# Patient Record
Sex: Female | Born: 1975 | Race: White | Marital: Single | Smoking: Current every day smoker
Health system: Southern US, Community
[De-identification: ages and names within clinical notes are randomized; demographics above are authoritative.]

## PROBLEM LIST (undated history)

## (undated) DIAGNOSIS — M549 Dorsalgia, unspecified: Secondary | ICD-10-CM

## (undated) DIAGNOSIS — J45909 Unspecified asthma, uncomplicated: Secondary | ICD-10-CM

## (undated) DIAGNOSIS — F603 Borderline personality disorder: Secondary | ICD-10-CM

## (undated) DIAGNOSIS — F419 Anxiety disorder, unspecified: Secondary | ICD-10-CM

## (undated) DIAGNOSIS — C801 Malignant (primary) neoplasm, unspecified: Secondary | ICD-10-CM

## (undated) DIAGNOSIS — G8929 Other chronic pain: Secondary | ICD-10-CM

## (undated) DIAGNOSIS — F319 Bipolar disorder, unspecified: Secondary | ICD-10-CM

## (undated) DIAGNOSIS — K922 Gastrointestinal hemorrhage, unspecified: Secondary | ICD-10-CM

## (undated) HISTORY — PX: KNEE SURGERY: SHX244

## (undated) HISTORY — PX: CHOLECYSTECTOMY: SHX55

## (undated) HISTORY — PX: ABDOMINAL HYSTERECTOMY: SHX81

---

## 2013-11-15 ENCOUNTER — Encounter (HOSPITAL_COMMUNITY): Payer: Self-pay | Admitting: Emergency Medicine

## 2013-11-15 ENCOUNTER — Emergency Department (HOSPITAL_COMMUNITY)
Admission: EM | Admit: 2013-11-15 | Discharge: 2013-11-16 | Disposition: A | Discharge status: Home / Self Care | Payer: MEDICAID | Attending: Emergency Medicine | Admitting: Emergency Medicine

## 2013-11-15 DIAGNOSIS — F319 Bipolar disorder, unspecified: Secondary | ICD-10-CM | POA: Insufficient documentation

## 2013-11-15 DIAGNOSIS — Z881 Allergy status to other antibiotic agents status: Secondary | ICD-10-CM | POA: Insufficient documentation

## 2013-11-15 DIAGNOSIS — F603 Borderline personality disorder: Secondary | ICD-10-CM | POA: Insufficient documentation

## 2013-11-15 DIAGNOSIS — Z888 Allergy status to other drugs, medicaments and biological substances status: Secondary | ICD-10-CM | POA: Insufficient documentation

## 2013-11-15 DIAGNOSIS — X58XXXA Exposure to other specified factors, initial encounter: Secondary | ICD-10-CM | POA: Insufficient documentation

## 2013-11-15 DIAGNOSIS — G8929 Other chronic pain: Secondary | ICD-10-CM | POA: Insufficient documentation

## 2013-11-15 DIAGNOSIS — Z3202 Encounter for pregnancy test, result negative: Secondary | ICD-10-CM | POA: Insufficient documentation

## 2013-11-15 DIAGNOSIS — Y939 Activity, unspecified: Secondary | ICD-10-CM | POA: Insufficient documentation

## 2013-11-15 DIAGNOSIS — Z885 Allergy status to narcotic agent status: Secondary | ICD-10-CM | POA: Insufficient documentation

## 2013-11-15 DIAGNOSIS — S0003XA Contusion of scalp, initial encounter: Secondary | ICD-10-CM | POA: Insufficient documentation

## 2013-11-15 DIAGNOSIS — F1010 Alcohol abuse, uncomplicated: Secondary | ICD-10-CM | POA: Insufficient documentation

## 2013-11-15 DIAGNOSIS — Z79899 Other long term (current) drug therapy: Secondary | ICD-10-CM | POA: Insufficient documentation

## 2013-11-15 DIAGNOSIS — J45909 Unspecified asthma, uncomplicated: Secondary | ICD-10-CM | POA: Insufficient documentation

## 2013-11-15 DIAGNOSIS — Y929 Unspecified place or not applicable: Secondary | ICD-10-CM | POA: Insufficient documentation

## 2013-11-15 DIAGNOSIS — Z8719 Personal history of other diseases of the digestive system: Secondary | ICD-10-CM | POA: Insufficient documentation

## 2013-11-15 DIAGNOSIS — Z859 Personal history of malignant neoplasm, unspecified: Secondary | ICD-10-CM | POA: Insufficient documentation

## 2013-11-15 DIAGNOSIS — M549 Dorsalgia, unspecified: Secondary | ICD-10-CM | POA: Insufficient documentation

## 2013-11-15 DIAGNOSIS — R45851 Suicidal ideations: Secondary | ICD-10-CM | POA: Insufficient documentation

## 2013-11-15 DIAGNOSIS — R4587 Impulsiveness: Secondary | ICD-10-CM | POA: Insufficient documentation

## 2013-11-15 DIAGNOSIS — IMO0002 Reserved for concepts with insufficient information to code with codable children: Secondary | ICD-10-CM | POA: Insufficient documentation

## 2013-11-15 DIAGNOSIS — F23 Brief psychotic disorder: Secondary | ICD-10-CM

## 2013-11-15 DIAGNOSIS — F29 Unspecified psychosis not due to a substance or known physiological condition: Secondary | ICD-10-CM | POA: Insufficient documentation

## 2013-11-15 HISTORY — DX: Gastrointestinal hemorrhage, unspecified: K92.2

## 2013-11-15 HISTORY — DX: Borderline personality disorder: F60.3

## 2013-11-15 HISTORY — DX: Malignant (primary) neoplasm, unspecified: C80.1

## 2013-11-15 HISTORY — DX: Dorsalgia, unspecified: M54.9

## 2013-11-15 HISTORY — DX: Bipolar disorder, unspecified: F31.9

## 2013-11-15 HISTORY — DX: Other chronic pain: G89.29

## 2013-11-15 HISTORY — DX: Anxiety disorder, unspecified: F41.9

## 2013-11-15 HISTORY — DX: Unspecified asthma, uncomplicated: J45.909

## 2013-11-15 LAB — BASIC METABOLIC PANEL
CO2: 20 mEq/L (ref 19–32)
Calcium: 9.6 mg/dL (ref 8.4–10.5)
Chloride: 108 mEq/L (ref 96–112)
GFR calc Af Amer: >90 mL/min (ref >90)
Potassium: 3.6 mEq/L (ref 3.5–5.1)
Sodium: 144 mEq/L (ref 135–145)

## 2013-11-15 LAB — URINALYSIS, ROUTINE W REFLEX MICROSCOPIC
Glucose, UA: NEGATIVE mg/dL
Ketones, ur: NEGATIVE mg/dL
Leukocytes, UA: NEGATIVE
Nitrite: NEGATIVE
Specific Gravity, Urine: 1.004 — ABNORMAL LOW (ref 1.005–1.030)
pH: 5.5 (ref 5.0–8.0)

## 2013-11-15 LAB — CBC WITH DIFFERENTIAL/PLATELET
Basophils Absolute: 0 10*3/uL (ref 0.0–0.1)
Eosinophils Relative: 1 % (ref 0–5)
HCT: 46 % (ref 36.0–46.0)
Lymphocytes Relative: 38 % (ref 12–46)
Lymphs Abs: 3.8 10*3/uL (ref 0.7–4.0)
MCH: 33.5 pg (ref 26.0–34.0)
MCV: 96.2 fL (ref 78.0–100.0)
Monocytes Absolute: 0.5 10*3/uL (ref 0.1–1.0)
Monocytes Relative: 5 % (ref 3–12)
Neutro Abs: 5.5 10*3/uL (ref 1.7–7.7)
Platelets: 348 10*3/uL (ref 150–400)
RBC: 4.78 MIL/uL (ref 3.87–5.11)
WBC: 9.9 10*3/uL (ref 4.0–10.5)

## 2013-11-15 LAB — URINE MICROSCOPIC-ADD ON

## 2013-11-15 LAB — ACETAMINOPHEN LEVEL: Acetaminophen (Tylenol), Serum: <15 ug/mL (ref 10–30)

## 2013-11-15 LAB — RAPID URINE DRUG SCREEN, HOSP PERFORMED
Cocaine: NOT DETECTED
Opiates: NOT DETECTED
Tetrahydrocannabinol: POSITIVE — AB

## 2013-11-15 LAB — ETHANOL: Alcohol, Ethyl (B): 180 mg/dL — ABNORMAL HIGH (ref 0–11)

## 2013-11-15 MED ORDER — ACETAMINOPHEN 325 MG PO TABS: 650.0000 mg | ORAL_TABLET | ORAL | Status: DC | PRN

## 2013-11-15 MED ORDER — ZIPRASIDONE MESYLATE 20 MG IM SOLR
10.0000 mg | Freq: Once | INTRAMUSCULAR | Status: AC
  Administered 2013-11-15: 10 mg via INTRAMUSCULAR
  Filled 2013-11-15: qty 20

## 2013-11-15 MED ORDER — STERILE WATER FOR INJECTION IJ SOLN
INTRAMUSCULAR | Status: AC
  Administered 2013-11-15: 1.2 mL
  Filled 2013-11-15: qty 10

## 2013-11-15 MED ORDER — LORAZEPAM 2 MG/ML IJ SOLN
2.0000 mg | Freq: Once | INTRAMUSCULAR | Status: AC
  Administered 2013-11-15: 2 mg via INTRAMUSCULAR
  Filled 2013-11-15: qty 1

## 2013-11-15 MED ORDER — LORAZEPAM 1 MG PO TABS
1.0000 mg | ORAL_TABLET | Freq: Three times a day (TID) | ORAL | Status: DC | PRN
  Administered 2013-11-16: 1 mg via ORAL
  Filled 2013-11-15: qty 1

## 2013-11-15 NOTE — ED Notes (Signed)
Patient's mother has arrived, informed her of the need to restrain the patient,

## 2013-11-15 NOTE — ED Notes (Signed)
Pt being verbally aggressive toward staff at this time and uncooperative.

## 2013-11-15 NOTE — ED Notes (Signed)
Pt acting out and kicking shoes off at staff and [employer], continuing to be verbally aggressive and acting out.  De-escalating attempts unsuccessful, will continue to monitor

## 2013-11-15 NOTE — ED Notes (Signed)
MD at bedside assessing pt.  Md removed C-collar and spider straps at this time
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
## 2013-11-15 NOTE — ED Notes (Signed)
Pt mother arrived and was made aware of pt condition.  Mother stated that pt has a long history of bipolar disorder and has a history of cancer, but stated she was not made aware of any recent diagnosis.

## 2013-11-15 NOTE — ED Notes (Signed)
No change in patient behavior, pt continues to be verbally and physically aggressive towards staff, continues to be uncooperative.

## 2013-11-15 NOTE — ED Notes (Signed)
Placed bedside commode in room.
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
## 2013-11-15 NOTE — ED Notes (Signed)
Mother has left contact information, wishes to be informed of changes in the plan of care.  States that if placed in a facility, patient would wish to be placed in one closer to [facility].

## 2013-11-16 ENCOUNTER — Encounter (HOSPITAL_COMMUNITY): Payer: Self-pay | Admitting: Emergency Medicine

## 2013-11-16 ENCOUNTER — Emergency Department (HOSPITAL_COMMUNITY): Payer: MEDICAID

## 2013-11-16 DIAGNOSIS — F1010 Alcohol abuse, uncomplicated: Secondary | ICD-10-CM

## 2013-11-16 DIAGNOSIS — F329 Major depressive disorder, single episode, unspecified: Secondary | ICD-10-CM

## 2013-11-16 DIAGNOSIS — F1910 Other psychoactive substance abuse, uncomplicated: Secondary | ICD-10-CM

## 2013-11-16 MED ORDER — ALBUTEROL SULFATE HFA 108 (90 BASE) MCG/ACT IN AERS: 1.0000 | INHALATION_SPRAY | Freq: Four times a day (QID) | RESPIRATORY_TRACT | Status: DC | PRN

## 2013-11-16 MED ORDER — MONTELUKAST SODIUM 10 MG PO TABS
10.0000 mg | ORAL_TABLET | Freq: Every day | ORAL | Status: DC
  Filled 2013-11-16: qty 1

## 2013-11-16 MED ORDER — PANTOPRAZOLE SODIUM 40 MG PO TBEC
40.0000 mg | DELAYED_RELEASE_TABLET | Freq: Every day | ORAL | Status: DC
  Administered 2013-11-16: 40 mg via ORAL
  Filled 2013-11-16: qty 1

## 2013-11-16 MED ORDER — PRAMIPEXOLE DIHYDROCHLORIDE 0.25 MG PO TABS
0.2500 mg | ORAL_TABLET | Freq: Every day | ORAL | Status: DC
  Filled 2013-11-16: qty 1

## 2013-11-16 MED ORDER — QUETIAPINE FUMARATE 25 MG PO TABS: 100.0000 mg | ORAL_TABLET | Freq: Every day | ORAL | Status: DC

## 2013-11-16 NOTE — ED Notes (Signed)
Pt. Placed in paper scrubs, belongings inventoried and put in bins and locked up with security. Pt. Still asleep at this time.

## 2013-11-16 NOTE — ED Notes (Signed)
Berna Spare interviews patient

## 2013-11-16 NOTE — ED Notes (Signed)
Patient transported to CT 

## 2013-11-16 NOTE — ED Notes (Signed)
Patient back from CT.
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
## 2013-11-16 NOTE — ED Provider Notes (Signed)
Patient seen by psychiatry and cleared for discharge. She denies any suicidal or homicidal thoughts. IVC rescinded by psychiatry  BP 122/89  Pulse 78  Temp(Src) 99.2 F (37.3 C) (Oral)  Resp 16  SpO2 98%   Glynn Octave, MD 11/16/13 1710

## 2013-11-16 NOTE — ED Notes (Addendum)
Patient requested that I call her Mother to find out where her car is as well as where her books are.  Mother stated that she is going to get her car today and take it home.  Mother Lowry Bowl [phone]

## 2013-11-16 NOTE — ED Notes (Signed)
Patient taken to CT.  Cooperative while in CT.
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
## 2013-11-18 NOTE — Consult Note (Signed)
Note reviewed and agreed with  

## 2014-02-16 NOTE — Consult Note (Signed)
Face to face evaluation and agree with this note 

## 2014-05-29 IMAGING — CT CT HEAD W/O CM
1 series · 16 of 30 positions shown, 20 images · non-contrast
Comparison: None.

CLINICAL DATA: Altered mental status.  Possible head injury.

EXAM:
CT HEAD WITHOUT CONTRAST
TECHNIQUE: Contiguous axial images were obtained from the base of the skull
through the vertex without intravenous contrast.

[Series 2: head 5.0 h30s · axial · 0.42mm/px · z∈[+1229,+1364]mm · 16 of 31 slices shown, 20 images]
[im 2/31  brain]
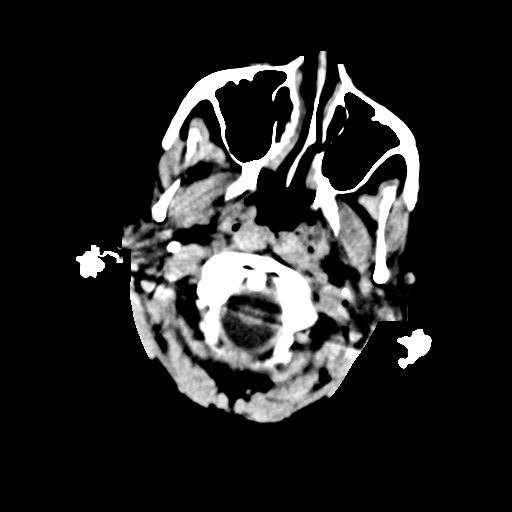
[im 2/31  bone]
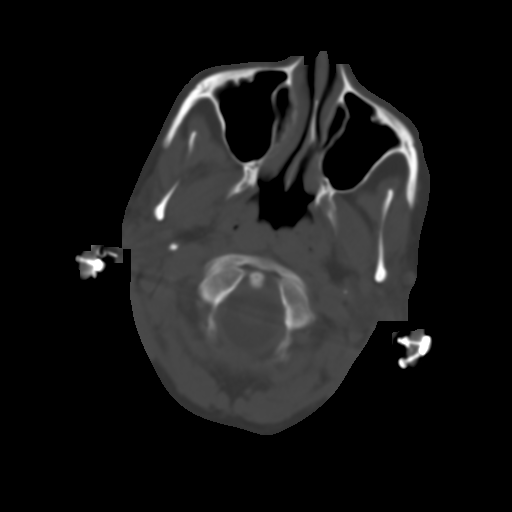
[im 4/31  brain]
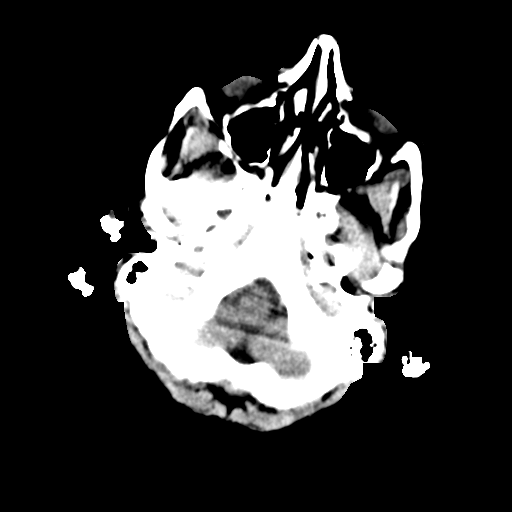
[im 6/31  brain]
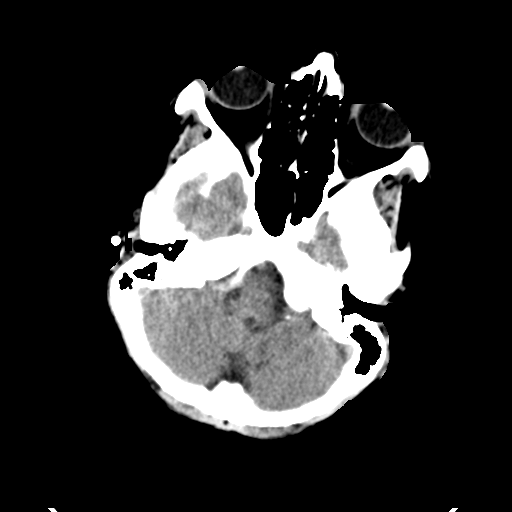
[im 8/31  brain]
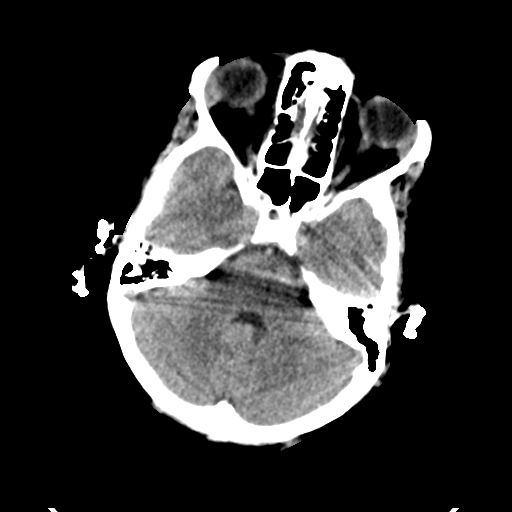
[im 9/31  brain]
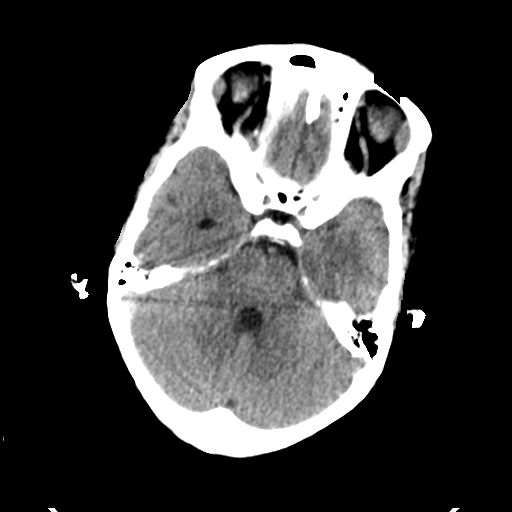
[im 9/31  bone]
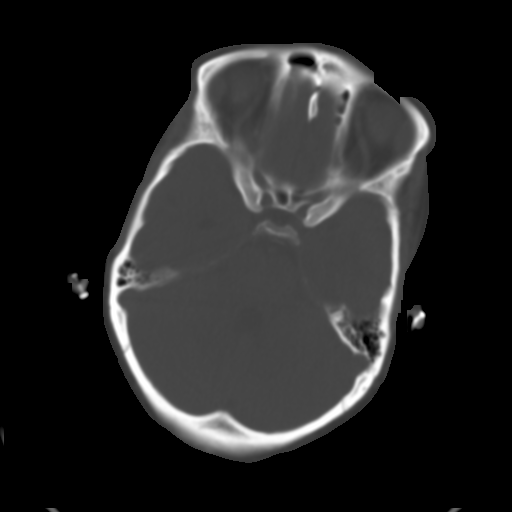
[im 11/31  brain]
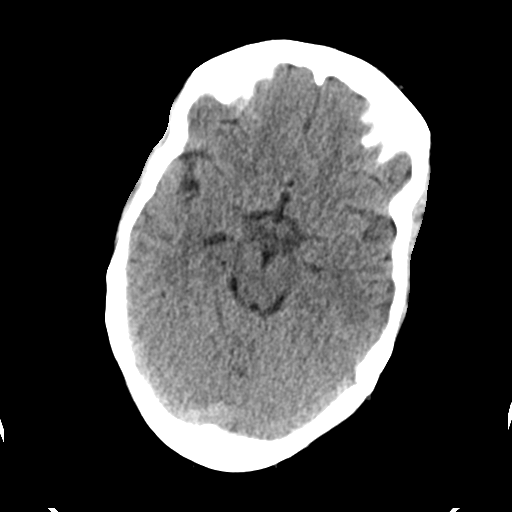
[im 13/31  brain]
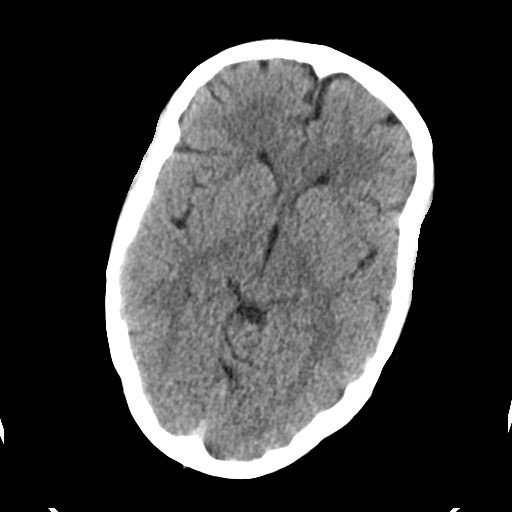
[im 15/31  brain]
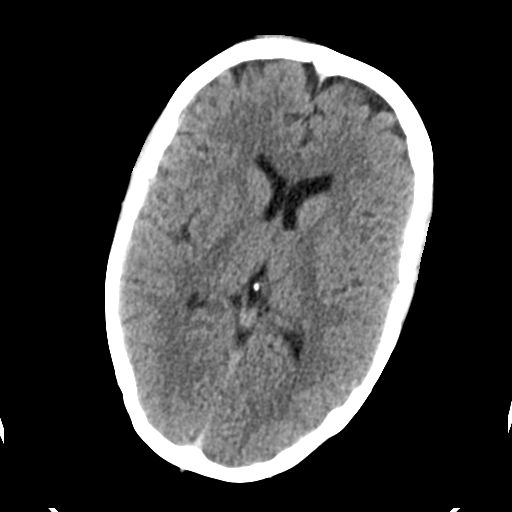
[im 16/31  brain]
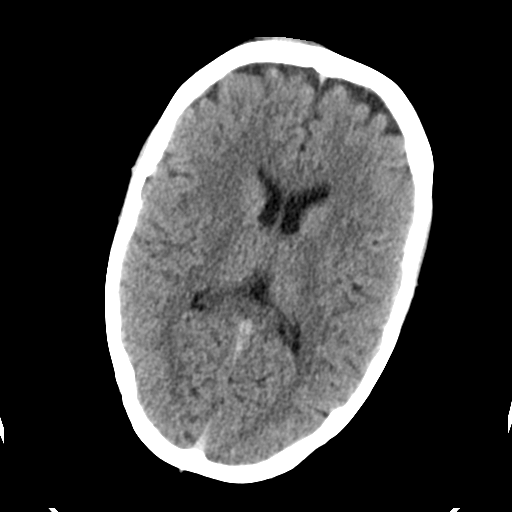
[im 16/31  bone]
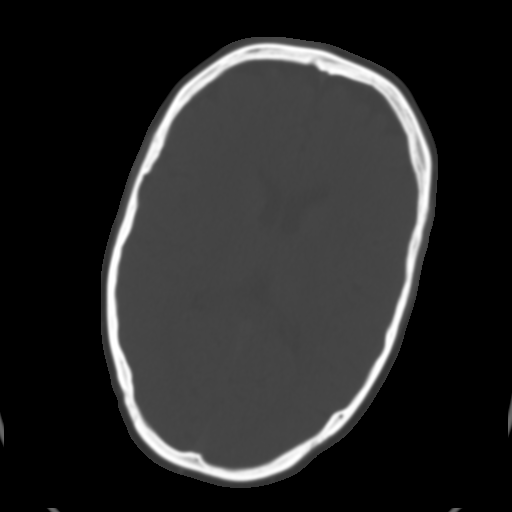
[im 18/31  brain]
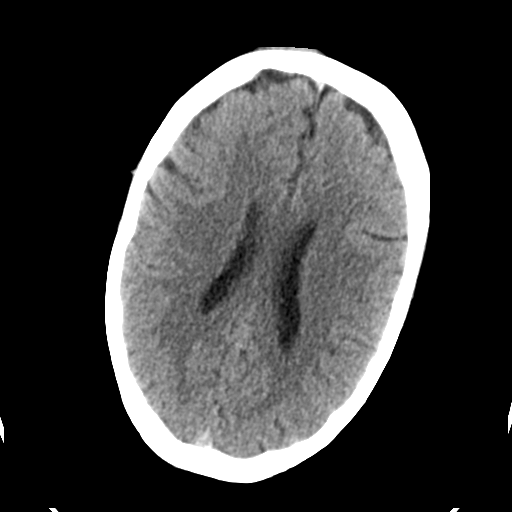
[im 20/31  brain]
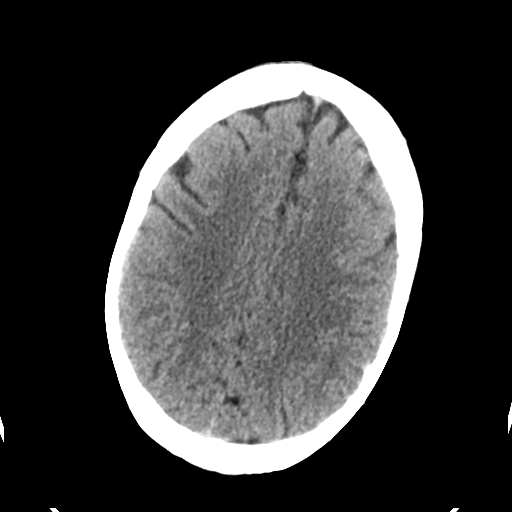
[im 22/31  brain]
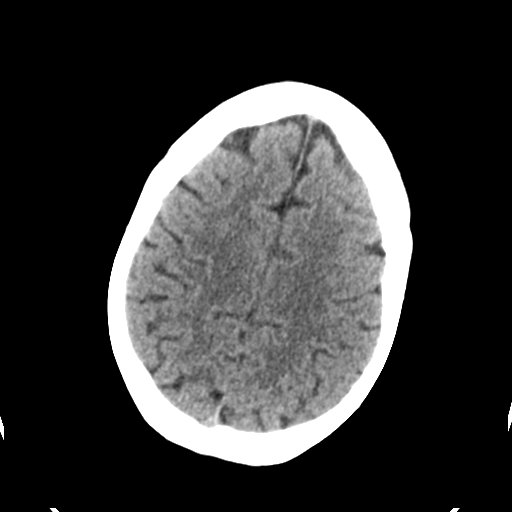
[im 23/31  brain]
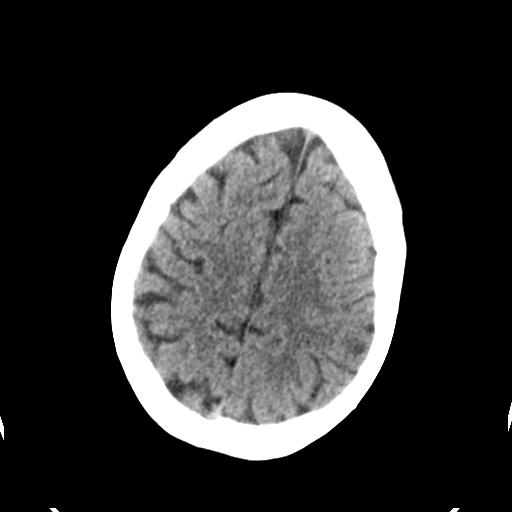
[im 23/31  bone]
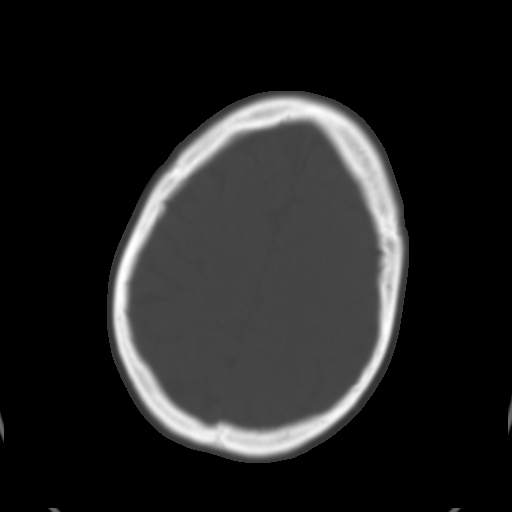
[im 25/31  brain]
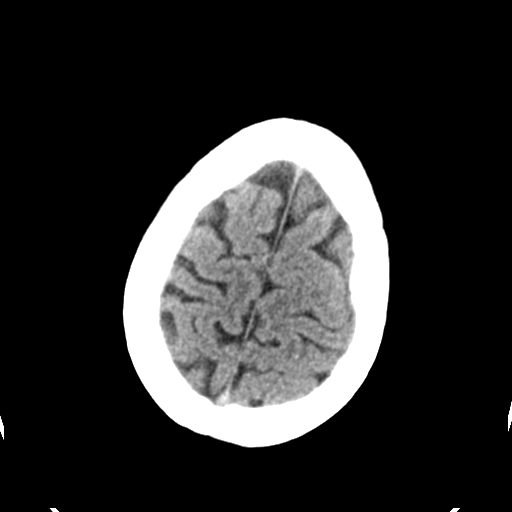
[im 27/31  brain]
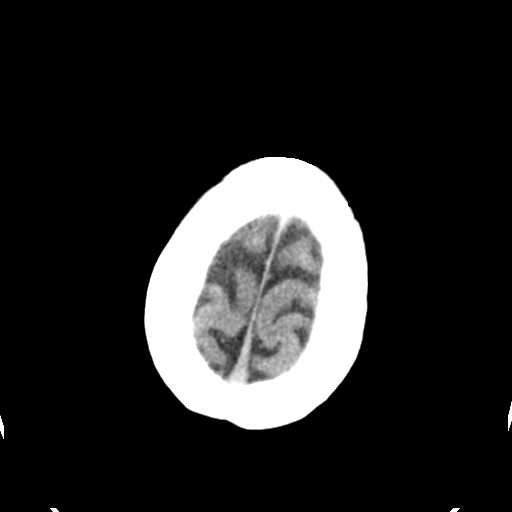
[im 29/31  brain]
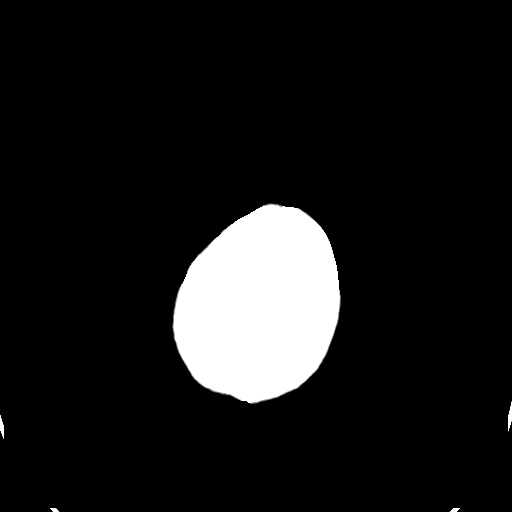

[16 of 30 positions shown; findings below may reference images not displayed]

FINDINGS: Right frontal scalp hematoma.

The brainstem, cerebellum, cerebral peduncles, thalamus, basal
ganglia, basilar cisterns, and ventricular system appear within
normal limits. No intracranial hemorrhage, mass lesion, or acute
CVA.
IMPRESSION: 1. No acute intracranial findings.
2. Right frontal scalp hematoma.
